# Patient Record
Sex: Male | Born: 1980 | Race: White | Hispanic: Yes | Marital: Married | Smoking: Never smoker
Health system: Southern US, Community
[De-identification: ages and names within clinical notes are randomized; demographics above are authoritative.]

## PROBLEM LIST (undated history)

## (undated) DIAGNOSIS — I10 Essential (primary) hypertension: Secondary | ICD-10-CM

---

## 2004-05-23 ENCOUNTER — Emergency Department (HOSPITAL_COMMUNITY): Admission: EM | Admit: 2004-05-23 | Discharge: 2004-05-23 | Payer: Self-pay | Admitting: Emergency Medicine

## 2004-05-24 ENCOUNTER — Emergency Department (HOSPITAL_COMMUNITY): Admission: EM | Admit: 2004-05-24 | Discharge: 2004-05-24 | Payer: Self-pay | Admitting: Emergency Medicine

## 2016-07-08 ENCOUNTER — Ambulatory Visit: Payer: Self-pay | Admitting: Pediatrics

## 2017-04-16 ENCOUNTER — Encounter (HOSPITAL_COMMUNITY): Payer: Self-pay

## 2017-04-16 ENCOUNTER — Emergency Department (HOSPITAL_COMMUNITY): Payer: Commercial Managed Care - PPO

## 2017-04-16 ENCOUNTER — Emergency Department (HOSPITAL_COMMUNITY)
Admission: EM | Admit: 2017-04-16 | Discharge: 2017-04-16 | Disposition: A | Discharge status: Home / Self Care | Payer: Commercial Managed Care - PPO | Attending: Emergency Medicine | Admitting: Emergency Medicine

## 2017-04-16 DIAGNOSIS — Y999 Unspecified external cause status: Secondary | ICD-10-CM | POA: Insufficient documentation

## 2017-04-16 DIAGNOSIS — I10 Essential (primary) hypertension: Secondary | ICD-10-CM | POA: Insufficient documentation

## 2017-04-16 DIAGNOSIS — Y92008 Other place in unspecified non-institutional (private) residence as the place of occurrence of the external cause: Secondary | ICD-10-CM | POA: Insufficient documentation

## 2017-04-16 DIAGNOSIS — Y9389 Activity, other specified: Secondary | ICD-10-CM | POA: Insufficient documentation

## 2017-04-16 DIAGNOSIS — Z79899 Other long term (current) drug therapy: Secondary | ICD-10-CM | POA: Diagnosis not present

## 2017-04-16 DIAGNOSIS — W010XXA Fall on same level from slipping, tripping and stumbling without subsequent striking against object, initial encounter: Secondary | ICD-10-CM | POA: Diagnosis not present

## 2017-04-16 DIAGNOSIS — S93492A Sprain of other ligament of left ankle, initial encounter: Secondary | ICD-10-CM | POA: Insufficient documentation

## 2017-04-16 DIAGNOSIS — R52 Pain, unspecified: Secondary | ICD-10-CM

## 2017-04-16 DIAGNOSIS — S99912A Unspecified injury of left ankle, initial encounter: Secondary | ICD-10-CM | POA: Diagnosis present

## 2017-04-16 HISTORY — DX: Essential (primary) hypertension: I10

## 2017-04-16 NOTE — ED Provider Notes (Signed)
[facility] Provider Note   CSN: [id number] Arrival date & time: 04/16/17  0005     History   Chief Complaint Chief Complaint  Patient presents with  . Fall    ankle injury    HPI Rick Walker is a 36 y.o. male.  The history is provided by the patient and the spouse.  Ankle Pain   Incident onset: just prior to arrival. The incident occurred at home. The injury mechanism was a fall. The pain is present in the left ankle. The quality of the pain is described as aching. The pain is moderate. The pain has been constant since onset. Associated symptoms include inability to bear weight. The symptoms are aggravated by activity. He has tried rest for the symptoms. The treatment provided mild relief.  pt slipped on wet deck and rolled left ankle Denies other complaints No other acute injuries reported  Past Medical History:  Diagnosis Date  . Hypertension     There are no active problems to display for this patient.   History reviewed. No pertinent surgical history.     Home Medications    Prior to Admission medications   Medication Sig Start Date End Date Taking? Authorizing Provider  amlodipine-benazepril (LOTREL) 2.5-10 MG capsule Take 1 capsule by mouth daily.   Yes [provider]  ibuprofen (ADVIL,MOTRIN) 400 MG tablet Take 400 mg by mouth every 6 (six) hours as needed.   Yes [provider]    Family History No family history on file.  Social History Social History  Substance Use Topics  . Smoking status: Never Smoker  . Smokeless tobacco: Current User    Types: Snuff  . Alcohol use Not on file     Allergies   Patient has no known allergies.   Review of Systems Review of Systems  Musculoskeletal: Positive for arthralgias and joint swelling. Negative for neck pain.  Neurological: Negative for headaches.     Physical Exam Updated Vital Signs BP (!) 149/101 (BP Location: Left Arm)   Pulse 93   Temp 98.1  F (36.7 C) (Oral)   Resp 15   Ht 1.753 m (5\' 9" )   Wt 83.9 kg (185 lb)   SpO2 100%   BMI 27.32 kg/m   Physical Exam CONSTITUTIONAL: Well developed/well nourished HEAD: Normocephalic/atraumatic EYES: EOMI ENMT: Mucous membranes moist NECK: supple no meningeal signs LUNGS:   no apparent distress ABDOMEN: soft NEURO: Pt is awake/alert/appropriate, moves all extremitiesx4.  No facial droop.   EXTREMITIES: pulses normal/equal, full ROM, tenderness/swelling to left lateral malleolus, no lacerations, no foot tenderness, left achilles intact, no left prox fib tenderness SKIN: warm, color normal PSYCH: no abnormalities of mood noted, alert and oriented to situation   ED Treatments / Results  Labs (all labs ordered are listed, but only abnormal results are displayed) Labs Reviewed - No data to display  EKG  EKG Interpretation None       Radiology Dg Ankle Complete Left  Result Date: 04/16/2017 CLINICAL DATA:  Left ankle pain after injury. Slipped on wet day rolling ankle. Felt a pop. Swelling. EXAM: LEFT ANKLE COMPLETE - 3+ VIEW COMPARISON:  None. FINDINGS: No fracture or dislocation. The ankle mortise is preserved. Small tibiotalar joint effusion. Soft tissue edema is most prominent laterally. IMPRESSION: No fracture or dislocation. Small joint effusion and soft tissue edema. Electronically Signed   By: Rubye OaksMelanie  Ehinger M.D.   On: 04/16/2017 01:38    Procedures Procedures (including critical care time)  Medications Ordered in ED Medications - No data to display   Initial Impression / Assessment and Plan / ED Course  I have reviewed the triage vital signs and the nursing notes.  Pertinent imaging results that were available during my care of the patient were reviewed by me and considered in my medical decision making (see chart for details).     Referred to ortho if no improvement in one week  Final Clinical Impressions(s) / ED Diagnoses   Final diagnoses:  Sprain  of anterior talofibular ligament of left ankle, initial encounter    New Prescriptions Discharge Medication List as of 04/16/2017  2:55 AM       Zadie Rhine, MD 04/16/17 [phone]

## 2017-04-16 NOTE — ED Triage Notes (Signed)
Pt slipped on his wet deck and rolled his left ankle, felt a pop and a snap, has swelling and tingling to the ankle

## 2019-04-21 IMAGING — DX DG ANKLE COMPLETE 3+V*L*
3 series · 3 of 3 positions shown · non-contrast
Comparison: None.

CLINICAL DATA: Left ankle pain after injury. Slipped on wet day
rolling ankle. Felt a pop. Swelling.

EXAM:
LEFT ANKLE COMPLETE - 3+ VIEW

[ankle ap]
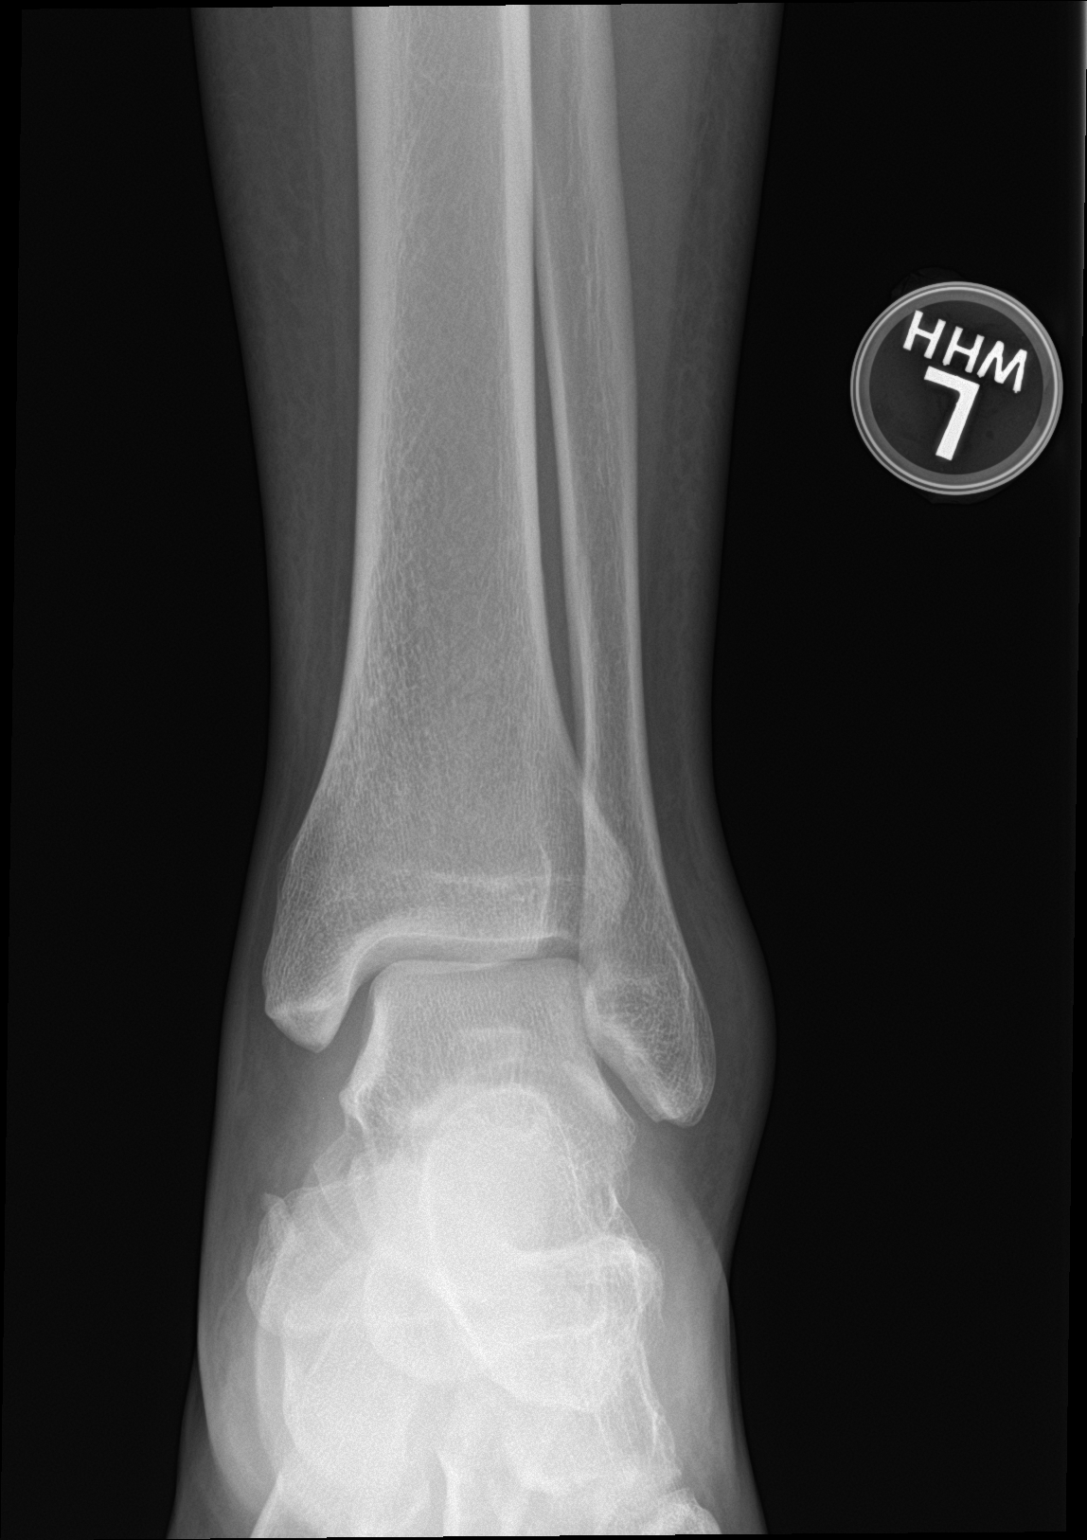

[ankle obl]
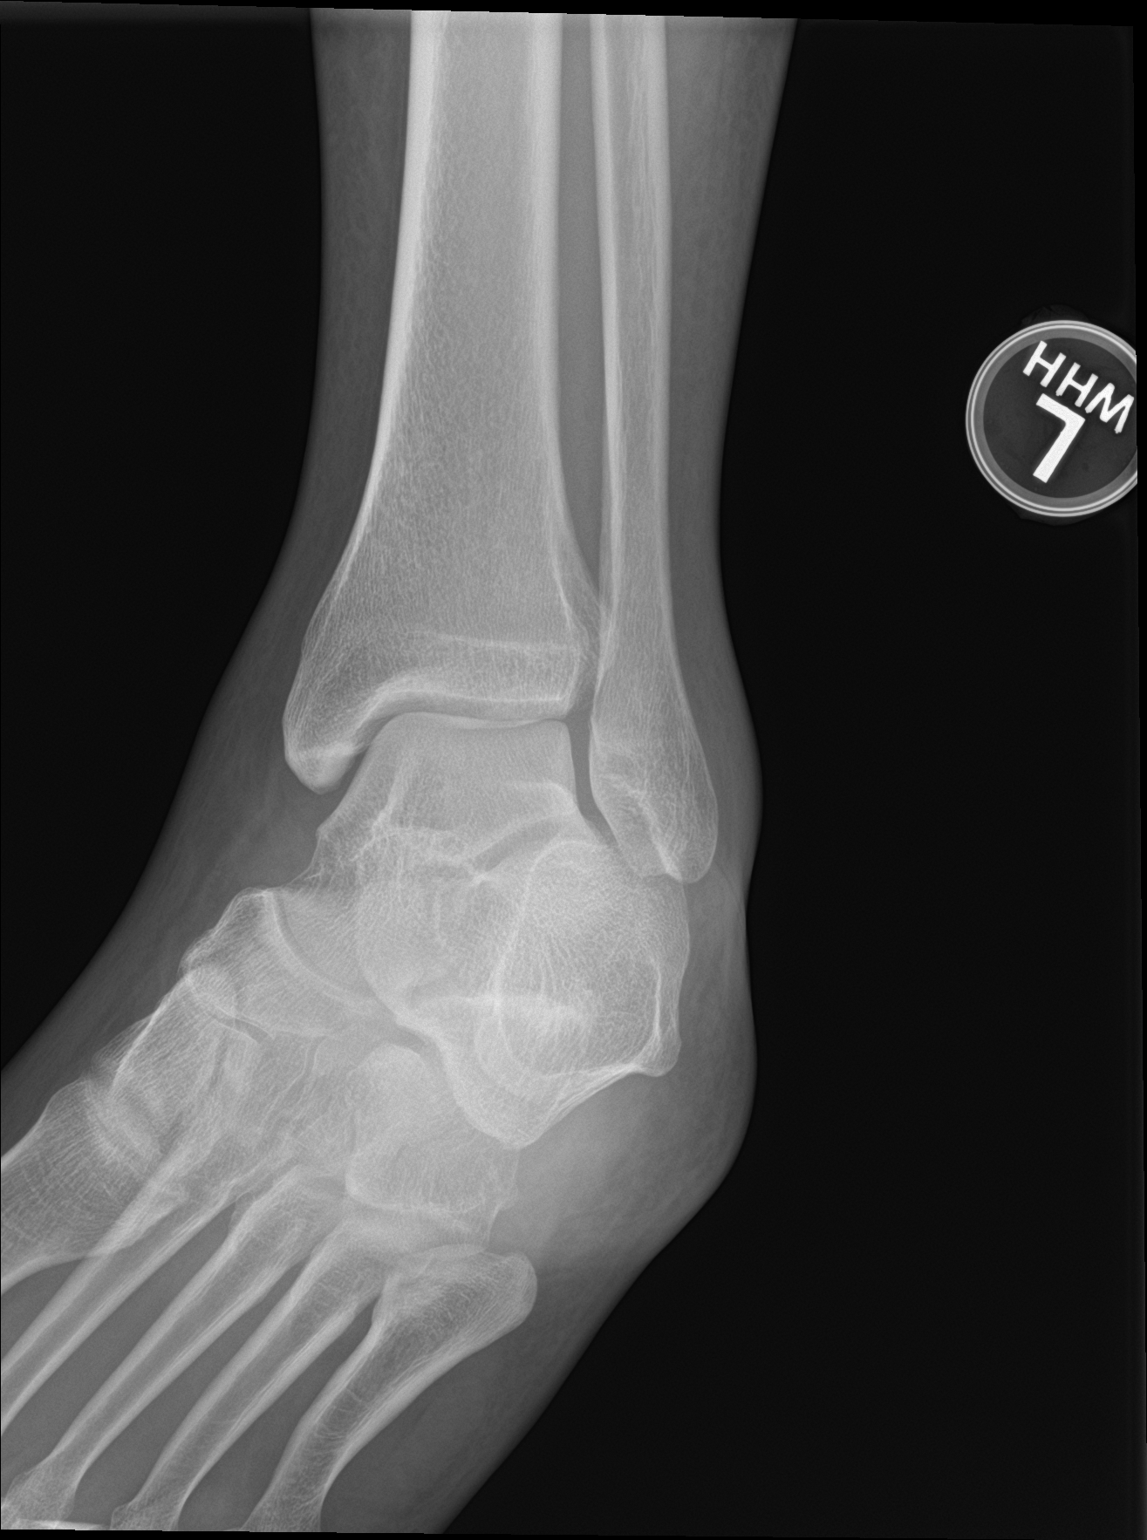

[ankle lat]
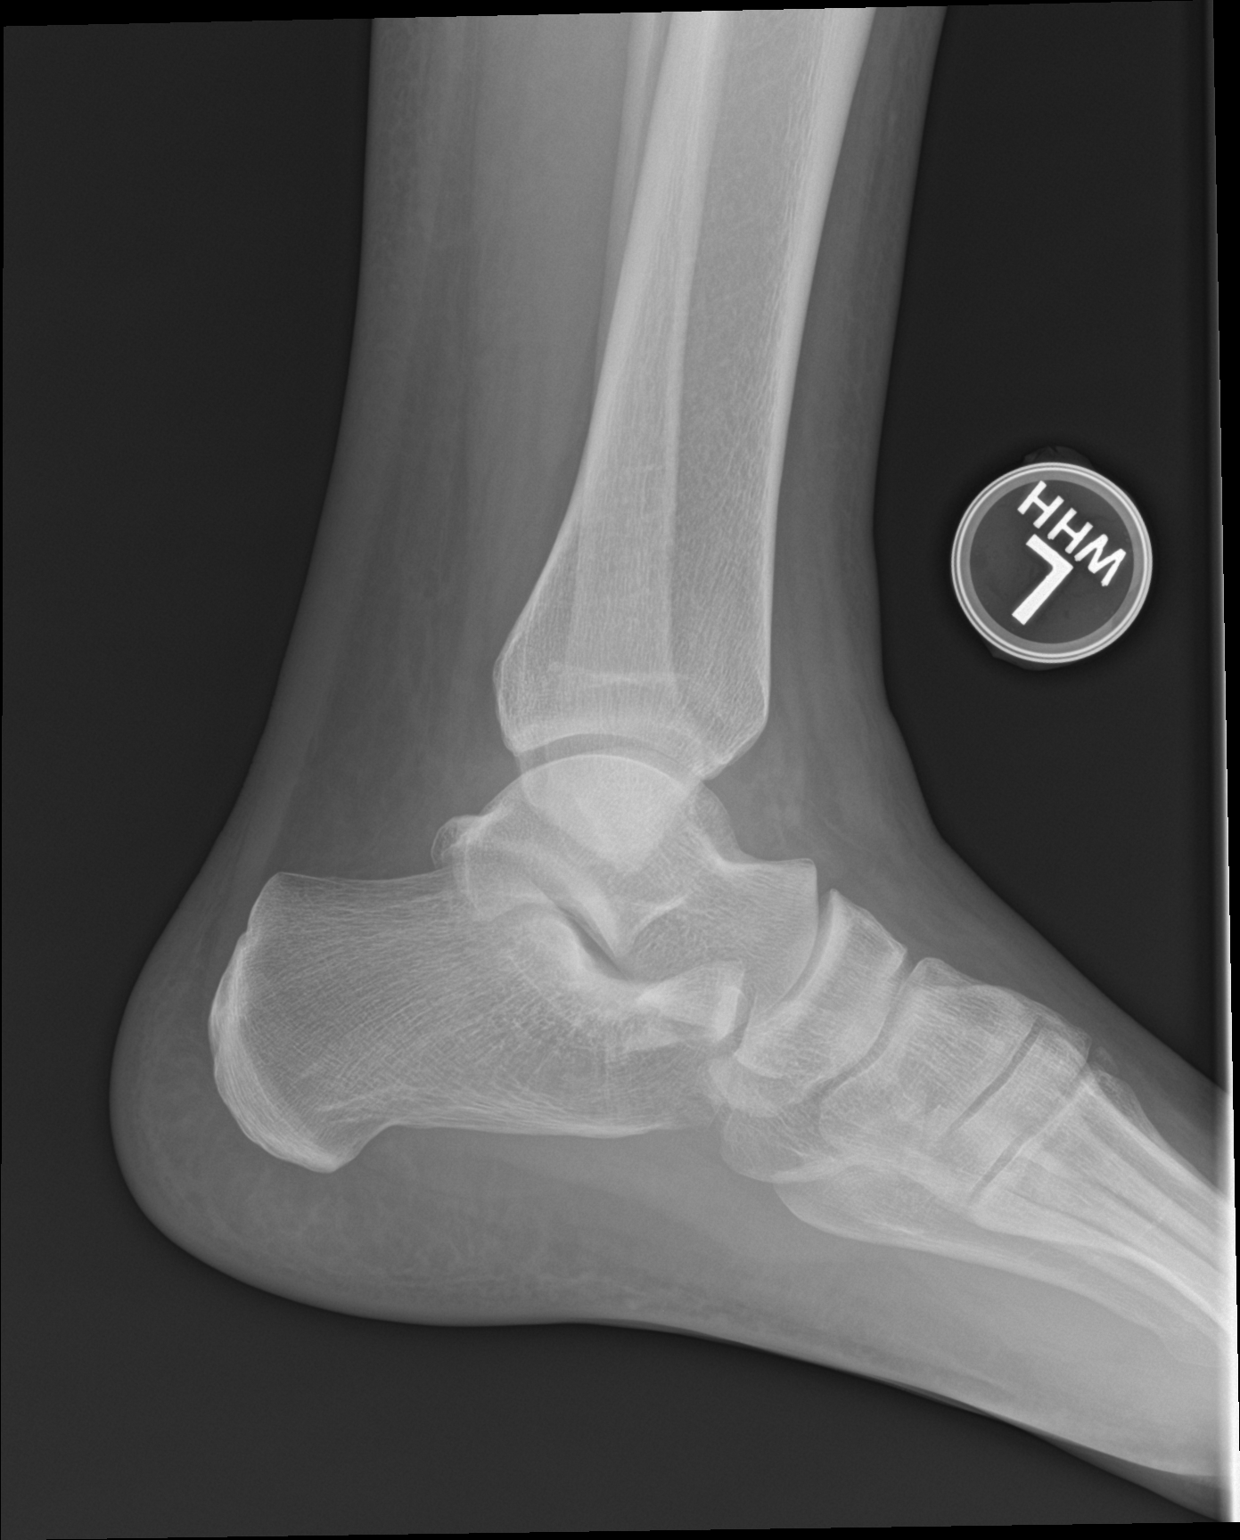

[3 of 3 positions shown; findings below may reference images not displayed]

FINDINGS: No fracture or dislocation. The ankle mortise is preserved. Small
tibiotalar joint effusion. Soft tissue edema is most prominent
laterally.
IMPRESSION: No fracture or dislocation.

Small joint effusion and soft tissue edema.

## 2020-04-01 ENCOUNTER — Institutional Professional Consult (permissible substitution): Payer: Commercial Managed Care - PPO | Admitting: Pulmonary Disease

## 2020-04-22 ENCOUNTER — Encounter: Payer: Self-pay | Admitting: Pulmonary Disease

## 2020-04-22 ENCOUNTER — Other Ambulatory Visit: Payer: Self-pay

## 2020-04-22 ENCOUNTER — Ambulatory Visit (INDEPENDENT_AMBULATORY_CARE_PROVIDER_SITE_OTHER): Payer: Commercial Managed Care - PPO | Admitting: Pulmonary Disease

## 2020-04-22 VITALS — BP 122/80 | HR 79 | Temp 98.2°F | Ht 68.0 in | Wt 201.0 lb

## 2020-04-22 DIAGNOSIS — G4733 Obstructive sleep apnea (adult) (pediatric): Secondary | ICD-10-CM | POA: Insufficient documentation

## 2020-04-22 DIAGNOSIS — R0683 Snoring: Secondary | ICD-10-CM | POA: Diagnosis not present

## 2020-04-22 DIAGNOSIS — Z9989 Dependence on other enabling machines and devices: Secondary | ICD-10-CM | POA: Insufficient documentation

## 2020-04-22 NOTE — Progress Notes (Signed)
Subjective:    Patient ID: Rick Walker, male    DOB: 11-24-1980, 39 y.o.   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 His wife has noted loud snoring for many years but more recently she has noted that he is gasping for air and jerking in his sleep and not able to catch his breath. He himself denies gasping or choking episodes that wake him up from sleep. Epworth sleepiness score is five and he is able to function well through his workday but reports some drowsiness while watching TV or lying down to rest in the afternoons on weekends. He drinks 1-5 times per day because he feels this helps him to wind down. Bedtime is around 10 PM, sleep latency about 30 minutes to an hour, he sleeps on his left side with one pillow, reports 2-3 nocturnal awakenings and is out of bed between 8-9 AM feeling tired with occasional dryness of mouth but denies headaches. He does drool. Weight is mostly unchanged. On weekends bedtime will be as late as 1 AM and wakes up around 9 AM. He denies excessive use of caffeinated beverages.  PMH -hypertension is controlled on three medications  Family history-his brother is 4 years older and obese and has OSA, father had prostate cancer He lives with his wife and 72 year old son  Past Medical History:  Diagnosis Date   Hypertension    History reviewed. No pertinent surgical history.  No Known Allergies  Social History   Socioeconomic History   Marital status: Married    Spouse name: Not on file   Number of children: Not on file   Years of education: Not on file   Highest education level: Not on file  Occupational History   Not on file  Tobacco Use   Smoking status: Never Smoker   Smokeless tobacco: Current User    Types: Snuff  Substance and Sexual Activity   Alcohol use: Not on file   Drug use: No   Sexual activity: Not on file  Other  Topics Concern   Not on file  Social History Narrative   Not on file   Social Determinants of Health   Financial Resource Strain:    Difficulty of Paying Living Expenses: Not on file  Food Insecurity:    Worried About Running Out of Food in the Last Year: Not on file   Ran Out of Food in the Last Year: Not on file  Transportation Needs:    Lack of Transportation (Medical): Not on file   Lack of Transportation (Non-Medical): Not on file  Physical Activity:    Days of Exercise per Week: Not on file   Minutes of Exercise per Session: Not on file  Stress:    Feeling of Stress : Not on file  Social Connections:    Frequency of Communication with Friends and Family: Not on file   Frequency of Social Gatherings with Friends and Family: Not on file   Attends Religious Services: Not on file   Active Member of Clubs or Organizations: Not on file   Attends Banker Meetings: Not on file   Marital Status: Not on file  Intimate Partner Violence:    Fear of Current or Ex-Partner: Not on file   Emotionally Abused: Not on file   Physically Abused: Not on file   Sexually Abused: Not on file     History reviewed. No pertinent family history.  Review of Systems Constitutional: negative for anorexia, fevers and sweats  Eyes: negative for irritation, redness and visual disturbance  Ears, nose, mouth, throat, and face: negative for earaches, epistaxis, nasal congestion and sore throat  Respiratory: negative for cough, dyspnea on exertion, sputum and wheezing  Cardiovascular: negative for chest pain, dyspnea, lower extremity edema, orthopnea, palpitations and syncope  Gastrointestinal: negative for abdominal pain, constipation, diarrhea, melena, nausea and vomiting  Genitourinary:negative for dysuria, frequency and hematuria  Hematologic/lymphatic: negative for bleeding, easy bruising and lymphadenopathy  Musculoskeletal:negative for arthralgias, muscle  weakness and stiff joints  Neurological: negative for coordination problems, gait problems, headaches and weakness  Endocrine: negative for diabetic symptoms including polydipsia, polyuria and weight loss     Objective:   Physical Exam  Gen. Pleasant, well-nourished, in no distress, normal affect ENT - no pallor,icterus, no post nasal drip, braces + bands in his teeth, class 2 airway Neck: No JVD, no thyromegaly, no carotid bruits Lungs: no use of accessory muscles, no dullness to percussion, clear without rales or rhonchi  Cardiovascular: Rhythm regular, heart sounds  normal, no murmurs or gallops, no peripheral edema Abdomen: soft and non-tender, no hepatosplenomegaly, BS normal. Musculoskeletal: No deformities, no cyanosis or clubbing Neuro:  alert, non focal       Assessment & Plan:

## 2020-04-22 NOTE — Patient Instructions (Signed)
Home sleep test. We discussed treatment options

## 2020-04-22 NOTE — Assessment & Plan Note (Signed)
His tiredness and somnolence does not seem to be excessive and he is able to function during his workday. Red flags include witnessed apneas and jerking movements in his sleep.  obstructive sleep apnea is possible & an overnight polysomnogram will be scheduled as a home study. The pathophysiology of obstructive sleep apnea , it's cardiovascular consequences & modes of treatment including CPAP were discused with the patient in detail & they evidenced understanding.  Pretest probability is intermediate. Would only treat if he has more than mild OSA. If mild, would consider positional therapy. I discussed effects of alcohol on sleep

## 2020-05-19 ENCOUNTER — Ambulatory Visit: Payer: Commercial Managed Care - PPO

## 2020-05-19 ENCOUNTER — Other Ambulatory Visit: Payer: Self-pay

## 2020-05-19 DIAGNOSIS — R0683 Snoring: Secondary | ICD-10-CM

## 2020-05-23 ENCOUNTER — Other Ambulatory Visit: Payer: Self-pay

## 2020-05-23 DIAGNOSIS — G4733 Obstructive sleep apnea (adult) (pediatric): Secondary | ICD-10-CM

## 2020-05-27 ENCOUNTER — Telehealth: Payer: Self-pay | Admitting: Pulmonary Disease

## 2020-05-27 DIAGNOSIS — R0683 Snoring: Secondary | ICD-10-CM

## 2020-05-27 DIAGNOSIS — G4733 Obstructive sleep apnea (adult) (pediatric): Secondary | ICD-10-CM | POA: Diagnosis not present

## 2020-05-27 NOTE — Telephone Encounter (Signed)
HST showed severe OSA with AHI 79/ hr ! Suggest autoCPAP  5-15 cm, mask of choice OV with me/APP in 8 wks

## 2020-05-28 NOTE — Telephone Encounter (Signed)
Called and went over HST results per Dr Vassie Loll with patient. All questions answered and patient expressed full understanding. Patient agreeable to Dr Reginia Naas recommendation for autoCPAP. Order placed per Dr Vassie Loll. Patient expressed understanding to please call the office to schedule follow up office visit 8 weeks after CPAP is set up. Nothing further needed at this time.

## 2020-06-05 ENCOUNTER — Telehealth: Payer: Self-pay

## 2020-06-05 NOTE — Telephone Encounter (Signed)
Called and scheduled office visit in [place] with Dr Vassie Loll for 08/12/2020 at 1:30 pm for follow up post CPAP set up.  Patient agreeable to time, date and location. Patient stated he has used the CPAP so far the past two nights. Patient stated the first night went well and he slept well the 2nd night too but after the 2nd night, when he woke up in the morning, patient stated his chest felt "tight". Patient stated he felt fine at time of phone call and it was only when he first got up in the morning. Patient is wondering if this happened because he has to get used to using the CPAP machine? Dr Vassie Loll please advise

## 2020-06-06 NOTE — Telephone Encounter (Signed)
Lets give him some more time to get used to the machine

## 2020-06-10 NOTE — Telephone Encounter (Signed)
Called and updated patient on Dr Reginia Naas response/recommendation. Patient expressed understanding and stated that he has been doing better. Patient aware to call if he encounters any further issues and confirmed scheduled office visit with Dr Vassie Loll on 08/12/2020 @1 :30pm at the [facility] office. Nothing further needed at this time.

## 2020-08-12 ENCOUNTER — Encounter: Payer: Self-pay | Admitting: Pulmonary Disease

## 2020-08-12 ENCOUNTER — Ambulatory Visit (INDEPENDENT_AMBULATORY_CARE_PROVIDER_SITE_OTHER): Payer: Commercial Managed Care - PPO | Admitting: Pulmonary Disease

## 2020-08-12 ENCOUNTER — Other Ambulatory Visit: Payer: Self-pay

## 2020-08-12 VITALS — BP 126/82 | HR 85 | Temp 97.9°F | Ht 68.0 in | Wt 209.8 lb

## 2020-08-12 DIAGNOSIS — G4733 Obstructive sleep apnea (adult) (pediatric): Secondary | ICD-10-CM

## 2020-08-12 DIAGNOSIS — Z9989 Dependence on other enabling machines and devices: Secondary | ICD-10-CM | POA: Diagnosis not present

## 2020-08-12 NOTE — Patient Instructions (Signed)
  Change to CPAP 10 cm, ramp time 15 mins New filters & nasal pillows

## 2020-08-12 NOTE — Progress Notes (Signed)
   Subjective:    Patient ID: Rick Walker, male    DOB: Jul 15, 1980, 40 y.o.   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 HST surprisingly showed severe OSA Symptoms were mainly loud snoring and refractory hypertension  PMH -hypertension is controlled on three medications  He was started on CPAP machine in December with auto CPAP settings 4 to 15 cm and this is worked well for him. Wife is happy snoring is resolved. He wakes up feeling rested, no sleep pressure in the daytime. He shows me blood pressure record from a blood pressure is better controlled. Transition from nasal mask to nasal pillows and this is working better for him   Significant tests/ events reviewed  HST 05/2020 severe OSA with AHI 79/ hr   Review of Systems neg for any significant sore throat, dysphagia, itching, sneezing, nasal congestion or excess/ purulent secretions, fever, chills, sweats, unintended wt loss, pleuritic or exertional cp, hempoptysis, orthopnea pnd or change in chronic leg swelling. Also denies presyncope, palpitations, heartburn, abdominal pain, nausea, vomiting, diarrhea or change in bowel or urinary habits, dysuria,hematuria, rash, arthralgias, visual complaints, headache, numbness weakness or ataxia.     Objective:   Physical Exam  Gen. Pleasant, well-nourished, in no distress ENT - no thrush, no pallor/icterus,no post nasal drip Neck: No JVD, no thyromegaly, no carotid bruits Lungs: no use of accessory muscles, no dullness to percussion, clear without rales or rhonchi  Cardiovascular: Rhythm regular, heart sounds  normal, no murmurs or gallops, no peripheral edema Musculoskeletal: No deformities, no cyanosis or clubbing        Assessment & Plan:

## 2020-08-12 NOTE — Assessment & Plan Note (Signed)
CPAP download was reviewed on auto settings which shows average pressure of 8 cm with maximum pressure of 9.5 cm, mild leak and good control of events. His compliance is good with average usage to 6 to 7 hours per night. He is settled down with nasal pillows  Change to CPAP 10 cm, ramp time 15 mins New filters & nasal pillows  Weight loss encouraged, compliance with goal of at least 4-6 hrs every night is the expectation. Advised against medications with sedative side effects Cautioned against driving when sleepy - understanding that sleepiness will vary on a day to day basis
